# Patient Record
Sex: Male | Born: 1960 | Race: Black or African American | Hispanic: No | Marital: Married | Smoking: Former smoker
Health system: Southern US, Community
[De-identification: ages and names within clinical notes are randomized; demographics above are authoritative.]

---

## 1998-03-25 ENCOUNTER — Emergency Department (HOSPITAL_COMMUNITY): Admission: EM | Admit: 1998-03-25 | Discharge: 1998-03-25 | Payer: Self-pay | Admitting: Emergency Medicine

## 1998-05-23 ENCOUNTER — Encounter: Admission: RE | Admit: 1998-05-23 | Discharge: 1998-05-23 | Payer: Self-pay | Admitting: Family Medicine

## 1998-05-27 ENCOUNTER — Encounter: Admission: RE | Admit: 1998-05-27 | Discharge: 1998-05-27 | Payer: Self-pay | Admitting: Family Medicine

## 1998-06-06 ENCOUNTER — Ambulatory Visit (HOSPITAL_COMMUNITY): Admission: RE | Admit: 1998-06-06 | Discharge: 1998-06-06 | Payer: Self-pay | Admitting: Sports Medicine

## 1998-06-08 ENCOUNTER — Encounter: Admission: RE | Admit: 1998-06-08 | Discharge: 1998-06-08 | Payer: Self-pay | Admitting: Family Medicine

## 2005-01-22 ENCOUNTER — Emergency Department (HOSPITAL_COMMUNITY): Admission: EM | Admit: 2005-01-22 | Discharge: 2005-01-22 | Payer: Self-pay | Admitting: Emergency Medicine

## 2010-12-31 ENCOUNTER — Encounter: Payer: Self-pay | Admitting: Internal Medicine

## 2011-11-19 ENCOUNTER — Other Ambulatory Visit (HOSPITAL_BASED_OUTPATIENT_CLINIC_OR_DEPARTMENT_OTHER): Payer: Self-pay | Admitting: Internal Medicine

## 2011-11-19 DIAGNOSIS — N2889 Other specified disorders of kidney and ureter: Secondary | ICD-10-CM

## 2017-12-26 ENCOUNTER — Other Ambulatory Visit (HOSPITAL_BASED_OUTPATIENT_CLINIC_OR_DEPARTMENT_OTHER): Payer: Self-pay

## 2017-12-26 DIAGNOSIS — G4710 Hypersomnia, unspecified: Secondary | ICD-10-CM

## 2018-01-01 ENCOUNTER — Ambulatory Visit (HOSPITAL_BASED_OUTPATIENT_CLINIC_OR_DEPARTMENT_OTHER): Payer: BLUE CROSS/BLUE SHIELD | Attending: Internal Medicine | Admitting: Internal Medicine

## 2018-01-01 VITALS — Ht 71.0 in | Wt 265.0 lb

## 2018-01-01 DIAGNOSIS — R0902 Hypoxemia: Secondary | ICD-10-CM | POA: Diagnosis not present

## 2018-01-01 DIAGNOSIS — G4710 Hypersomnia, unspecified: Secondary | ICD-10-CM | POA: Diagnosis present

## 2018-01-01 DIAGNOSIS — G4733 Obstructive sleep apnea (adult) (pediatric): Secondary | ICD-10-CM | POA: Insufficient documentation

## 2018-01-04 DIAGNOSIS — G4733 Obstructive sleep apnea (adult) (pediatric): Secondary | ICD-10-CM | POA: Diagnosis not present

## 2018-01-04 NOTE — Procedures (Signed)
    Patient Name: Jacob Woodard, [name] Date: 01/01/2018 Gender: Male D.O.B: Mar 15, 1961 Age (years): 56 Referring Provider: Amada KingfisherGeorge Osei Bonsu Height (inches): 71 Interpreting Physician: Jetty Duhamellinton Erwin Nishiyama MD, ABSM Weight (lbs): 265 RPSGT: Celene KrasCharles, Nicole BMI: 37 MRN: [id number] Neck Size: 17.00 <br> <br> CLINICAL INFORMATION Sleep Study Type: HST  Indication for sleep study: Excessive Daytime Sleepiness  Epworth Sleepiness Score: 6  SLEEP STUDY TECHNIQUE A multi-channel overnight portable sleep study was performed. The channels recorded were: nasal airflow, thoracic respiratory movement, and oxygen saturation with a pulse oximetry. Snoring was also monitored.  MEDICATIONS Patient self administered medications include: none reported.  SLEEP ARCHITECTURE Patient was studied for 521.5 minutes. The sleep efficiency was 98.8 % and the patient was supine for 41.7%. The arousal index was 0.1 per hour.  RESPIRATORY PARAMETERS The overall AHI was 56.4 per hour, with a central apnea index of 0.0 per hour. The oxygen nadir was 72% during sleep.  CARDIAC DATA Mean heart rate during sleep was 49.4 bpm.  IMPRESSIONS - Severe obstructive sleep apnea occurred during this study (AHI = 56.4/h). - No significant central sleep apnea occurred during this study (CAI = 0.0/h). - Severe oxygen desaturation was noted during this study (Min O2 = 72%, Mean 92%). - Patient snored.  DIAGNOSIS - Obstructive Sleep Apnea (327.23 [G47.33 ICD-10]) - Nocturnal Hypoxemia (327.26 [G47.36 ICD-10])  RECOMMENDATIONS - Recommend CPAP titration study or AutoPAP. Other options would be based on clinical judgment. - Be careful with alcohol, sedatives and other CNS depressants that may worsen sleep apnea and disrupt normal sleep architecture. - Sleep hygiene should be reviewed to assess factors that may improve sleep quality. - Weight management and regular exercise should be initiated or  continued.  [Electronically signed] 01/04/2018 10:46 AM  Jetty Duhamellinton Chanele Douglas MD, ABSM Diplomate, American Board of Sleep Medicine   NPI: [id number]                         Jetty Duhamellinton Adi Doro Diplomate, American Board of Sleep Medicine  ELECTRONICALLY SIGNED ON:  01/04/2018, 10:44 AM [facility] PH: [phone]   FX: [phone] ACCREDITED BY THE AMERICAN ACADEMY OF SLEEP MEDICINE

## 2018-01-30 ENCOUNTER — Ambulatory Visit (INDEPENDENT_AMBULATORY_CARE_PROVIDER_SITE_OTHER): Payer: BLUE CROSS/BLUE SHIELD

## 2018-01-30 ENCOUNTER — Ambulatory Visit: Payer: BLUE CROSS/BLUE SHIELD | Admitting: Podiatry

## 2018-01-30 VITALS — Ht 71.0 in | Wt 265.0 lb

## 2018-01-30 DIAGNOSIS — M79671 Pain in right foot: Secondary | ICD-10-CM

## 2018-01-30 DIAGNOSIS — M25572 Pain in left ankle and joints of left foot: Secondary | ICD-10-CM | POA: Diagnosis not present

## 2018-01-30 DIAGNOSIS — M79672 Pain in left foot: Principal | ICD-10-CM

## 2018-01-30 DIAGNOSIS — M2142 Flat foot [pes planus] (acquired), left foot: Secondary | ICD-10-CM

## 2018-01-30 DIAGNOSIS — M2141 Flat foot [pes planus] (acquired), right foot: Secondary | ICD-10-CM

## 2018-01-30 DIAGNOSIS — M659 Synovitis and tenosynovitis, unspecified: Secondary | ICD-10-CM

## 2018-01-30 NOTE — Progress Notes (Signed)
   Subjective:    Patient ID: Jacob Woodard, male    DOB: 1961/03/23, 57 y.o.   MRN: [id number]  HPI  Chief Complaint  Patient presents with  . 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 male presents with the above complaint.  Reports bilateral foot pain for many years duration.  Patient works in his feet all day reports pain in the inside and outside of his ankles as the day wears on.   No past medical history on file.   Current Outpatient Medications:  .  amLODipine-benazepril (LOTREL) 5-20 MG capsule, , Disp: , Rfl: 1 .  metoprolol succinate (TOPROL-XL) 25 MG 24 hr tablet, , Disp: , Rfl: 0  No Known Allergies   Review of Systems  Musculoskeletal: Positive for arthralgias, back pain and joint swelling.  All other systems reviewed and are negative.     Objective:   Physical Exam There were no vitals filed for this visit. General AA&O x3. Normal mood and affect.  Vascular Dorsalis pedis and posterior tibial pulses  present 2+ bilaterally  Capillary refill normal to all digits. Pedal hair growth normal.  Neurologic Epicritic sensation grossly present bilaterally.  Dermatologic No open lesions. Interspaces clear of maceration. Nails well groomed and normal in appearance.  Orthopedic: MMT 5/5 in dorsiflexion, plantarflexion, inversion, and eversion bilaterally. Hindfoot flexible bilaterally  PT Tendon Tenderness: positive Sinus Tarsi Tenderness: positive Ankle ROM diminished range of motion bilaterally. Silfverskiold Test: positive bilaterally.   Radiographs: Taken and reviewed. Increased talar declination. Decreased calcaneal inclination.  No evidence of tarsal coalition.     Assessment & Plan:  Patient was evaluated all questions answered  Pes planus with sinus tarsitis left -X-rays taken and reviewed. -Left STJ injection as below  -Patient would benefit from custom  orthotics to control the rear foot motion.  Will make appointment to fabricate orthotics for patient  -Powerstep inserts dispensed.  Procedure: Joint Injection Location: Left STJ joint Skin Prep: Alcohol. Injectate: 0.5 cc 1% lidocaine plain, 1 cc dexamethasone phosphate, 0.5 cc Kenalog 10 Disposition: Patient tolerated procedure well. Injection site dressed with a band-aid.

## 2018-02-13 ENCOUNTER — Other Ambulatory Visit: Payer: Self-pay | Admitting: Podiatry

## 2018-02-13 ENCOUNTER — Ambulatory Visit (INDEPENDENT_AMBULATORY_CARE_PROVIDER_SITE_OTHER): Payer: BLUE CROSS/BLUE SHIELD

## 2018-02-13 ENCOUNTER — Encounter (HOSPITAL_COMMUNITY): Payer: Self-pay | Admitting: Family Medicine

## 2018-02-13 ENCOUNTER — Ambulatory Visit (HOSPITAL_COMMUNITY)
Admission: EM | Admit: 2018-02-13 | Discharge: 2018-02-13 | Disposition: A | Discharge status: Home / Self Care | Payer: BLUE CROSS/BLUE SHIELD | Attending: Family Medicine | Admitting: Family Medicine

## 2018-02-13 DIAGNOSIS — S63502A Unspecified sprain of left wrist, initial encounter: Secondary | ICD-10-CM

## 2018-02-13 DIAGNOSIS — M25532 Pain in left wrist: Secondary | ICD-10-CM

## 2018-02-13 DIAGNOSIS — M659 Synovitis and tenosynovitis, unspecified: Secondary | ICD-10-CM

## 2018-02-13 MED ORDER — DICLOFENAC SODIUM 75 MG PO TBEC: 75.0000 mg | DELAYED_RELEASE_TABLET | Freq: Two times a day (BID) | ORAL | 0 refills | Status: DC

## 2018-02-13 NOTE — ED Triage Notes (Signed)
Pt here for left wrist pain after a fall this am and catching himself with outstretched hands. He sts the right hand and wrist are okay.

## 2018-02-17 NOTE — ED Provider Notes (Signed)
[facility]   [id number] 02/13/18 Arrival Time: 1258  ASSESSMENT & PLAN:  1. Left wrist pain   2. Sprain of left wrist, initial encounter    Imaging: Dg Wrist Complete Left  Result Date: 02/13/2018 CLINICAL DATA:  Status post fall landing on the left wrist. Persistent pain centered over the fourth and fifth metacarpals and distal ulna. EXAM: LEFT WRIST - COMPLETE 3+ VIEW COMPARISON:  None in PACs FINDINGS: The bones are subjectively adequately mineralized. There is no acute fracture or dislocation. There are degenerative changes of the intercarpal joint centered on the articulation of the distal pole of the scaphoid with the trapezium and trapezoid. There is mild degenerative change of the first [facility]. There are subtle cystic changes within the distal ulna and the lunate. The metacarpals are intact as are the distal radius and ulna. Specific attention to the scaphoid reveals no acute abnormality. There is mild diffuse soft tissue swelling. IMPRESSION: There are degenerative changes centered on the articulation of the scaphoid with the trapezium and trapezoid. There is no acute fracture nor dislocation. Electronically Signed   By: David  SwazilandJordan M.D.   On: 02/13/2018 13:39   Meds ordered this encounter  Medications  . diclofenac (VOLTAREN) 75 MG EC tablet    Sig: Take 1 tablet (75 mg total) by mouth 2 (two) times daily.    Dispense:  20 tablet    Refill:  0   Placed in wrist splint. Will plan f/u with PCP if not noticing improvement over the next week. Reviewed expectations re: course of current medical issues. Questions answered. Outlined signs and symptoms indicating need for more acute intervention. Patient verbalized understanding. After Visit Summary given.  SUBJECTIVE: History from: patient. Jacob Woodard is a 57 y.o. male who reports persistent localized mild to moderate pain of his left wrist that is stable; described as aching without radiation. Onset: abrupt,  today. Injury/trama: yes, reports falling and catching himself with his hands. Relieved by: resting. Worsened by: certain movements. Associated symptoms: none reported. Extremity sensation changes or weakness: none. Self treatment: tried OTCs with relief of pain. History of similar: yes  ROS: As per HPI.   OBJECTIVE:  Vitals:   02/13/18 1319  BP: (!) 159/94  Pulse: 61  Resp: 18  Temp: 98.4 F (36.9 C)  SpO2: 97%    General appearance: alert; no distress Extremities: no cyanosis or edema; symmetrical with no gross deformities; localized tenderness over his left wrist with mild swelling and no bruising; ROM: normal but with stiffness reported CV: normal extremity capillary refill Skin: warm and dry Neurologic: normal gait; normal symmetric reflexes in all extremities; normal sensation in all extremities Psychological: alert and cooperative; normal mood and affect  No Known Allergies   Social History   Socioeconomic History  . Marital status: Married    Spouse name: Not on file  . Number of children: Not on file  . Years of education: Not on file  . Highest education level: Not on file  Social Needs  . Financial resource strain: Not on file  . Food insecurity - worry: Not on file  . Food insecurity - inability: Not on file  . Transportation needs - medical: Not on file  . Transportation needs - non-medical: Not on file  Occupational History  . Not on file  Tobacco Use  . Smoking status: Not on file  Substance and Sexual Activity  . Alcohol use: Not on file  . Drug use: Not on file  .  Sexual activity: Not on file  Other Topics Concern  . Not on file  Social History Narrative  . Not on file   History reviewed. No pertinent surgical history.    Mardella Layman, MD 02/17/18 [phone]

## 2018-02-20 ENCOUNTER — Other Ambulatory Visit: Payer: BLUE CROSS/BLUE SHIELD | Admitting: Orthotics

## 2018-02-20 ENCOUNTER — Ambulatory Visit: Payer: BLUE CROSS/BLUE SHIELD | Admitting: Podiatry

## 2018-02-27 ENCOUNTER — Ambulatory Visit (INDEPENDENT_AMBULATORY_CARE_PROVIDER_SITE_OTHER): Payer: BLUE CROSS/BLUE SHIELD | Admitting: Orthotics

## 2018-02-27 DIAGNOSIS — M659 Synovitis and tenosynovitis, unspecified: Secondary | ICD-10-CM | POA: Diagnosis not present

## 2018-02-27 DIAGNOSIS — M2141 Flat foot [pes planus] (acquired), right foot: Secondary | ICD-10-CM

## 2018-02-27 DIAGNOSIS — M25572 Pain in left ankle and joints of left foot: Secondary | ICD-10-CM

## 2018-02-27 DIAGNOSIS — M2142 Flat foot [pes planus] (acquired), left foot: Secondary | ICD-10-CM

## 2018-02-27 DIAGNOSIS — M79672 Pain in left foot: Secondary | ICD-10-CM

## 2018-02-27 DIAGNOSIS — M79671 Pain in right foot: Secondary | ICD-10-CM

## 2018-02-27 NOTE — Progress Notes (Signed)
Patient came into today to be cast for Custom Foot Orthotics. Upon recommendation of Dr. Samuella CotaPrice  Patient presents with pes planus, pes planovalgus, stage 1 PTTD, sinus tarsiitus Goals are  Arch support, rear foot stability Plan vendor [employer]

## 2018-03-24 ENCOUNTER — Ambulatory Visit: Payer: BLUE CROSS/BLUE SHIELD | Admitting: Orthotics

## 2018-03-24 DIAGNOSIS — M25572 Pain in left ankle and joints of left foot: Secondary | ICD-10-CM

## 2018-03-24 DIAGNOSIS — M2141 Flat foot [pes planus] (acquired), right foot: Secondary | ICD-10-CM

## 2018-03-24 DIAGNOSIS — M2142 Flat foot [pes planus] (acquired), left foot: Principal | ICD-10-CM

## 2018-03-24 NOTE — Progress Notes (Signed)
Patient came in today to pick up custom made foot orthotics.  The goals were accomplished and the patient reported no dissatisfaction with said orthotics.  Patient was advised of breakin period and how to report any issues.Patient came in today to pick up custom made foot orthotics.  The goals were accomplished and the patient reported no dissatisfaction with said orthotics.  Patient was advised of breakin period and how to report any issues. 

## 2018-07-04 ENCOUNTER — Encounter: Payer: Self-pay | Admitting: Podiatry

## 2018-07-04 ENCOUNTER — Ambulatory Visit: Payer: BLUE CROSS/BLUE SHIELD | Admitting: Podiatry

## 2018-07-04 ENCOUNTER — Ambulatory Visit (INDEPENDENT_AMBULATORY_CARE_PROVIDER_SITE_OTHER): Payer: BLUE CROSS/BLUE SHIELD

## 2018-07-04 DIAGNOSIS — S99912A Unspecified injury of left ankle, initial encounter: Secondary | ICD-10-CM | POA: Diagnosis not present

## 2018-07-04 DIAGNOSIS — R600 Localized edema: Secondary | ICD-10-CM

## 2018-07-04 MED ORDER — DICLOFENAC SODIUM 75 MG PO TBEC: 75.0000 mg | DELAYED_RELEASE_TABLET | Freq: Two times a day (BID) | ORAL | 2 refills | Status: DC

## 2018-07-05 NOTE — Progress Notes (Signed)
Subjective:   Patient ID: Jacob LazarAnthony B Woodard, male   DOB: 57 y.o.   MRN: [id number]   HPI Patient states he jumped over a fence when playing tennis came down and hurt his ankle left and is been very sore and he is due to have to be on his foot for extended periods of time   ROS      Objective:  Physical Exam  Neurovascular status intact with inflammation more of the medial portion of the ankle joint with the deltoid ligament involved.  The lateral appears to be okay with mild edema and he does have reduced range of motion     Assessment:  Severe ankle sprain deformity of the left ankle     Plan:  H&P x-rays reviewed and today applied Unna boot Ace wrap surgical shoe to reduce the edema discussed rest ice compression and dispensed a Tri-Lock ankle brace in order to provide support of the entire ankle.  Patient will be seen back for us to reevaluate in 3 weeks or earlier if needed  X-ray indicates that there is no signs of fracture or diastases injury currently

## 2019-08-19 ENCOUNTER — Other Ambulatory Visit: Payer: Self-pay | Admitting: Podiatry

## 2020-03-24 DIAGNOSIS — Z01021 Encounter for examination of eyes and vision following failed vision screening with abnormal findings: Secondary | ICD-10-CM | POA: Diagnosis not present

## 2020-03-24 DIAGNOSIS — E119 Type 2 diabetes mellitus without complications: Secondary | ICD-10-CM | POA: Diagnosis not present

## 2020-03-24 DIAGNOSIS — Z01118 Encounter for examination of ears and hearing with other abnormal findings: Secondary | ICD-10-CM | POA: Diagnosis not present

## 2020-03-24 DIAGNOSIS — Z0001 Encounter for general adult medical examination with abnormal findings: Secondary | ICD-10-CM | POA: Diagnosis not present

## 2020-03-24 DIAGNOSIS — G4700 Insomnia, unspecified: Secondary | ICD-10-CM | POA: Diagnosis not present

## 2020-03-24 DIAGNOSIS — E782 Mixed hyperlipidemia: Secondary | ICD-10-CM | POA: Diagnosis not present

## 2020-03-24 DIAGNOSIS — Z136 Encounter for screening for cardiovascular disorders: Secondary | ICD-10-CM | POA: Diagnosis not present

## 2020-03-24 DIAGNOSIS — I4891 Unspecified atrial fibrillation: Secondary | ICD-10-CM | POA: Diagnosis not present

## 2020-03-24 DIAGNOSIS — Z1329 Encounter for screening for other suspected endocrine disorder: Secondary | ICD-10-CM | POA: Diagnosis not present

## 2020-03-24 DIAGNOSIS — I10 Essential (primary) hypertension: Secondary | ICD-10-CM | POA: Diagnosis not present

## 2020-04-08 DIAGNOSIS — I10 Essential (primary) hypertension: Secondary | ICD-10-CM | POA: Diagnosis not present

## 2020-04-08 DIAGNOSIS — I4891 Unspecified atrial fibrillation: Secondary | ICD-10-CM | POA: Diagnosis not present

## 2020-06-22 ENCOUNTER — Other Ambulatory Visit: Payer: Self-pay

## 2020-06-22 ENCOUNTER — Ambulatory Visit: Payer: Self-pay | Admitting: Podiatry

## 2020-06-22 ENCOUNTER — Encounter: Payer: Self-pay | Admitting: Podiatry

## 2020-06-22 ENCOUNTER — Other Ambulatory Visit: Payer: Self-pay | Admitting: Podiatry

## 2020-06-22 ENCOUNTER — Ambulatory Visit (INDEPENDENT_AMBULATORY_CARE_PROVIDER_SITE_OTHER): Payer: Self-pay

## 2020-06-22 VITALS — Temp 98.2°F

## 2020-06-22 DIAGNOSIS — M25572 Pain in left ankle and joints of left foot: Secondary | ICD-10-CM

## 2020-06-22 DIAGNOSIS — M76821 Posterior tibial tendinitis, right leg: Secondary | ICD-10-CM

## 2020-06-22 DIAGNOSIS — M25571 Pain in right ankle and joints of right foot: Secondary | ICD-10-CM

## 2020-06-22 DIAGNOSIS — M76822 Posterior tibial tendinitis, left leg: Secondary | ICD-10-CM

## 2020-06-22 MED ORDER — DICLOFENAC SODIUM 75 MG PO TBEC: 75.0000 mg | DELAYED_RELEASE_TABLET | Freq: Two times a day (BID) | ORAL | 2 refills | Status: AC

## 2020-06-23 NOTE — Progress Notes (Signed)
Subjective:   Patient ID: Jacob Woodard, male   DOB: 60 y.o.   MRN: [id number]   HPI Patient presents stating that my left ankle has really started to bother me and states that it is been worse when he tries to walk or be active neuro   ROS      Objective:  Physical Exam  Neurovascular status intact with patient's left medial ankle posterior tibial tendon just before it gets to the medial malleolus being inflamed with no indications of tendon damage or dysfunction.  Moderate depression of the arch noted has orthotics which are in not he feels fitted exactly right      Assessment:  Probability for posterior tibial tendinitis left with moderate flatfoot deformity     Plan:  H&P reviewed condition recommended steroidal injection careful and explained chances for rupture.  Sterile prep done injected more proximal into that she 3 mg dexamethasone Kenalog 5 mg Xylocaine applied fascial brace to lift the arch gave instructions for seeing rec for orthotics and supportive shoes and reappoint to recheck  X-rays indicate moderate depression of the arch bilateral with signs of arthritis that are minimal with no other pathology noted

## 2020-08-08 DIAGNOSIS — M25572 Pain in left ankle and joints of left foot: Secondary | ICD-10-CM | POA: Diagnosis not present

## 2020-12-19 DIAGNOSIS — M2141 Flat foot [pes planus] (acquired), right foot: Secondary | ICD-10-CM | POA: Diagnosis not present

## 2020-12-19 DIAGNOSIS — M19071 Primary osteoarthritis, right ankle and foot: Secondary | ICD-10-CM | POA: Diagnosis not present

## 2020-12-19 DIAGNOSIS — M792 Neuralgia and neuritis, unspecified: Secondary | ICD-10-CM | POA: Diagnosis not present

## 2020-12-19 DIAGNOSIS — M19072 Primary osteoarthritis, left ankle and foot: Secondary | ICD-10-CM | POA: Diagnosis not present

## 2021-01-03 DIAGNOSIS — M19072 Primary osteoarthritis, left ankle and foot: Secondary | ICD-10-CM | POA: Diagnosis not present

## 2021-01-03 DIAGNOSIS — M2141 Flat foot [pes planus] (acquired), right foot: Secondary | ICD-10-CM | POA: Diagnosis not present

## 2021-01-03 DIAGNOSIS — M2142 Flat foot [pes planus] (acquired), left foot: Secondary | ICD-10-CM | POA: Diagnosis not present

## 2021-01-03 DIAGNOSIS — M19071 Primary osteoarthritis, right ankle and foot: Secondary | ICD-10-CM | POA: Diagnosis not present

## 2021-02-28 DIAGNOSIS — M25374 Other instability, right foot: Secondary | ICD-10-CM | POA: Diagnosis not present

## 2021-02-28 DIAGNOSIS — R2689 Other abnormalities of gait and mobility: Secondary | ICD-10-CM | POA: Diagnosis not present

## 2021-02-28 DIAGNOSIS — M25375 Other instability, left foot: Secondary | ICD-10-CM | POA: Diagnosis not present

## 2021-02-28 DIAGNOSIS — M19072 Primary osteoarthritis, left ankle and foot: Secondary | ICD-10-CM | POA: Diagnosis not present

## 2021-02-28 DIAGNOSIS — M19071 Primary osteoarthritis, right ankle and foot: Secondary | ICD-10-CM | POA: Diagnosis not present

## 2021-10-23 DIAGNOSIS — S61209A Unspecified open wound of unspecified finger without damage to nail, initial encounter: Secondary | ICD-10-CM | POA: Diagnosis not present

## 2021-12-13 ENCOUNTER — Other Ambulatory Visit: Payer: Self-pay

## 2021-12-13 ENCOUNTER — Encounter (HOSPITAL_COMMUNITY): Payer: Self-pay

## 2021-12-13 ENCOUNTER — Emergency Department (HOSPITAL_COMMUNITY)
Admission: EM | Admit: 2021-12-13 | Discharge: 2021-12-14 | Disposition: A | Discharge status: Home / Self Care | Payer: BC Managed Care – PPO | Attending: Emergency Medicine | Admitting: Emergency Medicine

## 2021-12-13 DIAGNOSIS — M5442 Lumbago with sciatica, left side: Secondary | ICD-10-CM | POA: Insufficient documentation

## 2021-12-13 DIAGNOSIS — M545 Low back pain, unspecified: Secondary | ICD-10-CM | POA: Diagnosis not present

## 2021-12-13 DIAGNOSIS — Z79899 Other long term (current) drug therapy: Secondary | ICD-10-CM | POA: Insufficient documentation

## 2021-12-13 DIAGNOSIS — I10 Essential (primary) hypertension: Secondary | ICD-10-CM | POA: Diagnosis not present

## 2021-12-13 DIAGNOSIS — M5432 Sciatica, left side: Secondary | ICD-10-CM

## 2021-12-13 MED ORDER — OXYCODONE-ACETAMINOPHEN 5-325 MG PO TABS
1.0000 | ORAL_TABLET | Freq: Once | ORAL | Status: AC
  Administered 2021-12-13: 1 via ORAL
  Filled 2021-12-13 (×2): qty 1

## 2021-12-13 NOTE — ED Triage Notes (Signed)
Pt arrived POV c/o left leg pain. Pt states the pain has been going on for a few days but worse today. Pt believes it might be sciatica. Pt also states his BP is elevated but not currently being treated.

## 2021-12-13 NOTE — ED Provider Triage Note (Signed)
Emergency Medicine Provider Triage Evaluation Note  Jacob Woodard , a 61 y.o. male  was evaluated in triage.  Pt complains of left back and leg pain.  Pt thinks he has sciatica  Review of Systems  Positive: Numbness left leg Negative: No abdominal pain  Physical Exam  BP (!) 158/120 (BP Location: Left Arm)    Pulse (!) 118    Temp 98.9 F (37.2 C) (Oral)    Resp 18    Ht 5\' 11"  (1.803 m)    Wt 81.6 kg    SpO2 100%    BMI 25.10 kg/m  Gen:   Awake, no distress   Resp:  Normal effort  MSK:   Moves extremities without difficulty  Other:  Pain with movement left leg  Medical Decision Making  Medically screening exam initiated at 5:05 PM.  Appropriate orders placed.  LATRELL BONDY was informed that the remainder of the evaluation will be completed by another provider, this initial triage assessment does not replace that evaluation, and the importance of remaining in the ED until their evaluation is complete.     Fransico Meadow, [place] 12/13/21 1706

## 2021-12-14 DIAGNOSIS — M9904 Segmental and somatic dysfunction of sacral region: Secondary | ICD-10-CM | POA: Diagnosis not present

## 2021-12-14 DIAGNOSIS — M9902 Segmental and somatic dysfunction of thoracic region: Secondary | ICD-10-CM | POA: Diagnosis not present

## 2021-12-14 DIAGNOSIS — M9903 Segmental and somatic dysfunction of lumbar region: Secondary | ICD-10-CM | POA: Diagnosis not present

## 2021-12-14 DIAGNOSIS — M5416 Radiculopathy, lumbar region: Secondary | ICD-10-CM | POA: Diagnosis not present

## 2021-12-14 MED ORDER — KETOROLAC TROMETHAMINE 15 MG/ML IJ SOLN
15.0000 mg | Freq: Once | INTRAMUSCULAR | Status: AC
  Administered 2021-12-14: 15 mg via INTRAMUSCULAR
  Filled 2021-12-14: qty 1

## 2021-12-14 MED ORDER — LIDOCAINE 5 % EX PTCH
1.0000 | MEDICATED_PATCH | CUTANEOUS | Status: DC
  Administered 2021-12-14: 1 via TRANSDERMAL
  Filled 2021-12-14: qty 1

## 2021-12-14 MED ORDER — PREDNISONE 20 MG PO TABS: 40.0000 mg | ORAL_TABLET | Freq: Every day | ORAL | 0 refills | Status: AC

## 2021-12-14 MED ORDER — AMLODIPINE BESYLATE 5 MG PO TABS: 5.0000 mg | ORAL_TABLET | Freq: Every day | ORAL | 0 refills | Status: AC

## 2021-12-14 MED ORDER — LIDOCAINE 4 % EX PTCH: 1.0000 | MEDICATED_PATCH | Freq: Two times a day (BID) | CUTANEOUS | 0 refills | Status: AC | PRN

## 2021-12-14 NOTE — ED Notes (Signed)
Discharge instructions reviewed with patient and family. Patient and family verbalized understanding of instructions. Follow-up care and medications were reviewed. Patient wheeled out of ED in wheelchair. VSS upon discharge.  °

## 2021-12-14 NOTE — ED Provider Notes (Addendum)
[facility] EMERGENCY DEPARTMENT Provider Note   CSN: [id number] Arrival date & time: 12/13/21  1612     History  Chief Complaint  Patient presents with   Leg Pain    Jacob Woodard is a 61 y.o. male with past medical history of hypertension (noncompliant with medication).  Presents emergency department with a chief complaint of back pain and hypertension.  Patient reports that his back pain has been present over the last 4 days.  Pain is located to left lumbar back and radiates to left leg.  Pain is constant.  At present patient rates pain 10.  Pain scale.  Patient is worse with movement.  Patient reports taking Aleve earlier yesterday morning with no improvement in his symptoms.  Patient describes pain as "spasm and aching."  Patient denies any recent falls or traumatic injuries.  States that he occasionally does heavy lifting but does not remember injuring himself.  Patient reports that he has a history of high blood pressure.  States that he was previously prescribed Norvasc.  Patient states that he has run out of this medication and has not taken it recently.  States that he is currently transitioning between primary care providers.  Patient denies any fever, chills, unexpected weight loss, night sweats, IV drug use, history of malignancy, dysuria, hematuria, urinary urgency, numbness, weakness, saddle anesthesia, bowel or bladder dysfunction, chest pain, shortness of breath, visual disturbance.   Leg Pain Associated symptoms: back pain   Associated symptoms: no fever and no neck pain       Home Medications Prior to Admission medications   Medication Sig Start Date End Date Taking? Authorizing Provider  amLODipine-benazepril (LOTREL) 5-20 MG capsule  01/18/18   [provider]  diclofenac (VOLTAREN) 75 MG EC tablet Take 1 tablet (75 mg total) by mouth 2 (two) times daily. 06/22/20   Wallene Huh, DPM  metoprolol succinate (TOPROL-XL) 25 MG 24 hr  tablet  10/25/17   [provider]      Allergies    Patient has no known allergies.    Review of Systems   Review of Systems  Constitutional:  Negative for chills and fever.  Eyes:  Negative for visual disturbance.  Respiratory:  Negative for shortness of breath.   Cardiovascular:  Negative for chest pain.  Gastrointestinal:  Negative for abdominal pain, nausea and vomiting.  Genitourinary:  Negative for difficulty urinating, dysuria, frequency, hematuria and urgency.  Musculoskeletal:  Positive for back pain. Negative for neck pain.  Skin:  Negative for color change and rash.  Neurological:  Negative for dizziness, syncope, weakness, light-headedness, numbness and headaches.  Psychiatric/Behavioral:  Negative for confusion.    Physical Exam Updated Vital Signs BP (!) 175/123 (BP Location: Left Arm)    Pulse 72    Temp 98.4 F (36.9 C) (Oral)    Resp 20    Ht 5\' 11"  (1.803 m)    Wt 81.6 kg    SpO2 100%    BMI 25.10 kg/m  Physical Exam Vitals and nursing note reviewed.  Constitutional:      General: He is not in acute distress.    Appearance: He is not ill-appearing, toxic-appearing or diaphoretic.  HENT:     Head: Normocephalic.  Eyes:     General: No scleral icterus.       Right eye: No discharge.        Left eye: No discharge.  Cardiovascular:     Rate and Rhythm: Normal rate.  Pulses:          Dorsalis pedis pulses are 2+ on the right side and 2+ on the left side.  Pulmonary:     Effort: Pulmonary effort is normal.  Abdominal:     Tenderness: There is no right CVA tenderness or left CVA tenderness.  Musculoskeletal:     Cervical back: No swelling, edema, deformity, erythema, signs of trauma, lacerations, rigidity, spasms, torticollis, tenderness, bony tenderness or crepitus. No pain with movement. Normal range of motion.     Thoracic back: No swelling, edema, deformity, signs of trauma, lacerations, spasms, tenderness or bony tenderness.     Lumbar back:  Tenderness present. No swelling, edema, deformity, signs of trauma, lacerations, spasms or bony tenderness. Positive left straight leg raise test. Negative right straight leg raise test.     Right hip: No deformity, tenderness, bony tenderness or crepitus.     Left hip: No deformity, tenderness, bony tenderness or crepitus.     Right upper leg: Normal.     Left upper leg: Normal.     Right knee: No swelling, deformity, effusion, erythema, ecchymosis, lacerations, bony tenderness or crepitus. Normal range of motion. No tenderness. Normal alignment.     Left knee: No swelling, deformity, effusion, erythema, ecchymosis, lacerations, bony tenderness or crepitus. Normal range of motion. No tenderness. Normal alignment.     Right ankle: No swelling, deformity, ecchymosis or lacerations. No tenderness. Normal range of motion.     Left ankle: No swelling, deformity, ecchymosis or lacerations. No tenderness. Normal range of motion.     Right foot: Normal range of motion and normal capillary refill. No swelling, deformity, laceration, tenderness, bony tenderness or crepitus. Normal pulse.     Left foot: Normal range of motion and normal capillary refill. No swelling, laceration, tenderness, bony tenderness or crepitus. Normal pulse.     Comments: Tenderness to left lumbar back  Skin:    General: Skin is warm and dry.  Neurological:     General: No focal deficit present.     Mental Status: He is alert.  Psychiatric:        Behavior: Behavior is cooperative.    ED Results / Procedures / Treatments   Labs (all labs ordered are listed, but only abnormal results are displayed) Labs Reviewed - No data to display  EKG None  Radiology No results found.  Procedures Procedures    Medications Ordered in ED Medications  oxyCODONE-acetaminophen (PERCOCET/ROXICET) 5-325 MG per tablet 1 tablet (1 tablet Oral Given 12/13/21 1711)    ED Course/ Medical Decision Making/ A&P                            Medical Decision Making   This patient presents to the ED for concern of back pain and hypertension, this involves an extensive number of treatment options, and is a complaint that carries with it a high risk of complications and morbidity.  The differential diagnosis includes but is not limited to UTI, pyelonephritis, renal calculus, muscle strain, malignancy   Co morbidities that complicate the patient evaluation  Hypertension   Additional history obtained:  Additional history obtained from patient's daughter at bedside   Medicines ordered and prescription drug management:  I ordered medication including Toradol and lidocaine patch for patient's pain Reevaluation of the patient after these medicines showed that the patient stayed the same I have reviewed the patients home medicines and have made adjustments as needed  Test Considered:  Urinalysis, lumbar x-ray, left hip x-ray Low suspicion for pyelonephritis versus renal calculus as patient denies any urinary symptoms. Low suspicion for fracture as patient has no recent falls or traumatic injuries.  No bony tenderness on exam.     Problem List / ED Course:  Back pain Hypertension       Dispostion:  After consideration of the diagnostic results and the patients response to treatment, I feel that the patent would benefit from prescription for steroids and lidocaine patch.  Suspect that patient's pain is due to sciatica versus muscle strain.  Patient given information to follow-up with orthopedic provider if pain is not improved.  Discussed symptomatic treatment with over-the-counter pain medication.  Additionally patient has documented history of hypertension.  Patient reports he is no longer taking any medications for his hypertension.  We will start patient on 5 mg Norvasc.  Patient to follow-up with PCP for further management of hypertension.  Discussed results, findings, treatment and follow up. Patient advised of  return precautions. Patient verbalized understanding and agreed with plan.          Final Clinical Impression(s) / ED Diagnoses Final diagnoses:  Hypertension, unspecified type  Sciatica of left side    Rx / DC Orders ED Discharge Orders          Ordered    amLODipine (NORVASC) 5 MG tablet  Daily        12/14/21 0100    predniSONE (DELTASONE) 20 MG tablet  Daily        12/14/21 0100    Lidocaine (HM LIDOCAINE PATCH) 4 % PTCH  Every 12 hours PRN        12/14/21 0100              Loni Beckwith, PA-C 12/14/21 0227    Loni Beckwith, PA-C 12/14/21 0228    Margette Fast, MD 12/17/21 [phone]

## 2021-12-14 NOTE — Discharge Instructions (Addendum)
You came to the emergency department today to be evaluated for your back pain and high blood pressure.  Your blood pressure was found to be elevated in the emergency department.  I have started you on the medication Norvasc.  Please take this medication as prescribed.  Please follow-up with your primary care provider for further management of your high blood pressure.  Your back and leg pain is consistent with sciatica.  Due to this I prescribed you with steroids.  Some common side effects include feelings of extra energy, feeling warm, increased appetite, and stomach upset.  If you are diabetic your sugars may run higher than usual.   Please take Ibuprofen (Advil, motrin) and Tylenol (acetaminophen) to relieve your pain.    You may take up to 600 MG (3 pills) of normal strength ibuprofen every 8 hours as needed.   You make take tylenol, up to 1,000 mg (two extra strength pills) every 8 hours as needed.   It is safe to take ibuprofen and tylenol at the same time as they work differently.   Do not take more than 3,000 mg tylenol in a 24 hour period (not more than one dose every 8 hours.  Please check all medication labels as many medications such as pain and cold medications may contain tylenol.  Do not drink alcohol while taking these medications.  Do not take other NSAID'S while taking ibuprofen (such as aleve or naproxen).  Please take ibuprofen with food to decrease stomach upset.  Get help right away if: You are not able to control when you urinate or have bowel movements (incontinence). You have: Weakness in your lower back, pelvis, buttocks, or legs that gets worse. Redness or swelling of your back. A burning sensation when you urinate. Develop a severe headache or confusion. Have unusual weakness or numbness. Feel faint. Have severe pain in your chest or abdomen. Vomit repeatedly. Have trouble breathing.

## 2021-12-15 ENCOUNTER — Other Ambulatory Visit: Payer: Self-pay

## 2021-12-15 ENCOUNTER — Emergency Department (HOSPITAL_COMMUNITY)
Admission: EM | Admit: 2021-12-15 | Discharge: 2021-12-15 | Disposition: A | Discharge status: Home / Self Care | Payer: BC Managed Care – PPO | Attending: Emergency Medicine | Admitting: Emergency Medicine

## 2021-12-15 ENCOUNTER — Encounter (HOSPITAL_COMMUNITY): Payer: Self-pay

## 2021-12-15 ENCOUNTER — Emergency Department (HOSPITAL_COMMUNITY): Payer: BC Managed Care – PPO

## 2021-12-15 DIAGNOSIS — M5432 Sciatica, left side: Secondary | ICD-10-CM | POA: Insufficient documentation

## 2021-12-15 DIAGNOSIS — I10 Essential (primary) hypertension: Secondary | ICD-10-CM | POA: Diagnosis not present

## 2021-12-15 DIAGNOSIS — M5442 Lumbago with sciatica, left side: Secondary | ICD-10-CM | POA: Diagnosis not present

## 2021-12-15 DIAGNOSIS — M25552 Pain in left hip: Secondary | ICD-10-CM | POA: Diagnosis not present

## 2021-12-15 DIAGNOSIS — M545 Low back pain, unspecified: Secondary | ICD-10-CM | POA: Diagnosis not present

## 2021-12-15 DIAGNOSIS — Z79899 Other long term (current) drug therapy: Secondary | ICD-10-CM | POA: Insufficient documentation

## 2021-12-15 MED ORDER — TIZANIDINE HCL 4 MG PO TABS
2.0000 mg | ORAL_TABLET | Freq: Once | ORAL | Status: AC
  Administered 2021-12-15: 2 mg via ORAL
  Filled 2021-12-15: qty 1

## 2021-12-15 MED ORDER — DEXAMETHASONE SODIUM PHOSPHATE 10 MG/ML IJ SOLN
10.0000 mg | Freq: Once | INTRAMUSCULAR | Status: AC
  Administered 2021-12-15: 10 mg via INTRAVENOUS
  Filled 2021-12-15: qty 1

## 2021-12-15 MED ORDER — HYDROCODONE-ACETAMINOPHEN 5-325 MG PO TABS: 1.0000 | ORAL_TABLET | Freq: Four times a day (QID) | ORAL | 0 refills | Status: AC | PRN

## 2021-12-15 MED ORDER — HYDROMORPHONE HCL 1 MG/ML IJ SOLN
1.0000 mg | Freq: Once | INTRAMUSCULAR | Status: AC
  Administered 2021-12-15: 1 mg via INTRAVENOUS
  Filled 2021-12-15: qty 1

## 2021-12-15 NOTE — ED Triage Notes (Addendum)
Pt BIB [facility] from home. Patient states as he was walking he felt a pop in the left hip and fell straight to the ground. Pt has a hx of left hip pain. No obvious deformities to left hip. Patient received 200 of fentanyl by EMS. Patient was placed on amlodipine for hypertension earlier this week.  Vital signs were:  200/130 84- HR

## 2021-12-15 NOTE — ED Provider Notes (Signed)
[facility]-EMERGENCY DEPT Provider Note   CSN: [id number] Arrival date & time: 12/15/21  1148     History  No chief complaint on file.   Jacob Woodard is a 61 y.o. male.  HPI Patient presents 2 days after being seen at our affiliated facility now off concern for acute exacerbation of left superior inferior back pain following an audible pop sensation.  He notes that this illness has been about 1 week in duration, he was seen and evaluated 2 days ago, diagnosed with musculoskeletal etiology for his pain, has followed up with without like a chiropractor has had x-rays that were reassuring.  Today, just prior to ED arrival he was taking a step when he felt an audible pop, since that time he had severe 15/10 pain in the left posterior lateral superior hip.  He had relief with IV narcotics provided by EMS.  He did not completely fall, has no other complaints of pain.  Pain is radiating from the back down the left leg circumferentially towards the medial left knee.  No incontinence, no fever, no loss of sensation distally.  Pain is worse with motion, better at rest.    Home Medications Prior to Admission medications   Medication Sig Start Date End Date Taking? Authorizing Provider  amLODipine (NORVASC) 5 MG tablet Take 1 tablet (5 mg total) by mouth daily. 12/14/21  Yes Haskel SchroederBadalamente, Peter R, PA-C  HYDROcodone-acetaminophen (NORCO/VICODIN) 5-325 MG tablet Take 1 tablet by mouth every 6 (six) hours as needed for severe pain. 12/15/21  Yes Gerhard MunchLockwood, Mitchelle Goerner, MD  ibuprofen (ADVIL) 200 MG tablet Take 400 mg by mouth every 6 (six) hours as needed for mild pain.   Yes [provider]  Lidocaine (HM LIDOCAINE PATCH) 4 % PTCH Apply 1 patch topically every 12 (twelve) hours as needed. 12/14/21  Yes Haskel SchroederBadalamente, Peter R, PA-C  predniSONE (DELTASONE) 20 MG tablet Take 2 tablets (40 mg total) by mouth daily for 5 days. 12/14/21 12/19/21 Yes Badalamente, Rose PhiPeter R, PA-C  diclofenac  (VOLTAREN) 75 MG EC tablet Take 1 tablet (75 mg total) by mouth 2 (two) times daily. Patient not taking: Reported on 12/15/2021 06/22/20   Lenn Sinkegal, Norman S, DPM      Allergies    Patient has no known allergies.    Review of Systems   Review of Systems  Constitutional:        Per HPI, otherwise negative  HENT:         Per HPI, otherwise negative  Respiratory:         Per HPI, otherwise negative  Cardiovascular:        Per HPI, otherwise negative  Gastrointestinal:  Negative for vomiting.  Endocrine:       Negative aside from HPI  Genitourinary:        Neg aside from HPI   Musculoskeletal:        Per HPI, otherwise negative  Skin: Negative.   Neurological:  Negative for syncope.   Physical Exam Updated Vital Signs BP (!) 153/122    Pulse 85    Temp 98.4 F (36.9 C) (Oral)    Resp 19    Ht 5\' 11"  (1.803 m)    Wt 81.6 kg    SpO2 93%    BMI 25.09 kg/m  Physical Exam Vitals and nursing note reviewed.  Constitutional:      General: He is not in acute distress.    Appearance: He is well-developed.  HENT:  Head: Normocephalic and atraumatic.  Eyes:     Conjunctiva/sclera: Conjunctivae normal.  Cardiovascular:     Rate and Rhythm: Normal rate and regular rhythm.     Pulses: Normal pulses.  Pulmonary:     Effort: Pulmonary effort is normal. No respiratory distress.     Breath sounds: No stridor.  Abdominal:     General: There is no distension.  Musculoskeletal:     Comments: No deformities.  Patient moves his left leg initially in an animated fashion stating that he feels better, but with small amounts of motion he has acute recurrence of pain, focally in the left lateral superior posterior hip region.  Knee unremarkable, ankle unremarkable.  Skin:    General: Skin is warm and dry.  Neurological:     Mental Status: He is alert and oriented to person, place, and time.    ED Results / Procedures / Treatments   Labs (all labs ordered are listed, but only abnormal results  are displayed) Labs Reviewed - No data to display  EKG None  Radiology DG Lumbar Spine Complete  Result Date: 12/15/2021 CLINICAL DATA:  pain at L lateral inferior area after "pop" sensation EXAM: LUMBAR SPINE - COMPLETE 4+ VIEW COMPARISON:  None. FINDINGS: There appear to be small ribs at T12. The last well-defined vertebral body is labeled L5 for purposes of this exam. There are 5 non-rib-bearing lumbar-type vertebral bodies. Normal alignment. The lumbar vertebral body heights are maintained. There is multilevel degenerative disc disease of the lower lumbar spine which is most advanced and moderate in severity at L4-L5. The soft tissues are unremarkable. IMPRESSION: Multilevel degenerative disc disease of the lower lumbar spine most pronounced at L4-L5. Electronically Signed   By: Olive Bass M.D.   On: 12/15/2021 14:05    Procedures Procedures    Medications Ordered in ED Medications  HYDROmorphone (DILAUDID) injection 1 mg (1 mg Intravenous Given 12/15/21 1250)  tiZANidine (ZANAFLEX) tablet 2 mg (2 mg Oral Given 12/15/21 1247)  dexamethasone (DECADRON) injection 10 mg (10 mg Intravenous Given 12/15/21 1248)    ED Course/ Medical Decision Making/ A&P                           Medical Decision Making With consideration of fracture versus lumbosacral radiculopathy, consideration of intra abdominal injury patient had x-ray ordered, received analgesics and I reviewed his chart.  Chart review notable for recent evaluation with initiation of steroids, anti-inflammatories.  Patient joined by his wife  3:19 PM Patient states that this is the best he has felt in about 1 week.  He, his wife and I discussed all findings and have reviewed the x-ray, interpreted myself, agreed with interpretation and provided this to the patient.  I reviewed exam the patient is moving all extremities, continues to deny distal complaints, states that he feels better.  On reviewing his chart, reviewing his x-rays,  vital signs, physical exam, no negation for CNS involvement, no evidence for fracture, no evidence for soft tissue mass.  Some suspicion for lumbosacral radiculopathy, possibly exacerbated by today's acute event.  With improvement here, after initiation of additional medications patient is appropriate for discharge with referrals.        Final Clinical Impression(s) / ED Diagnoses Final diagnoses:  Acute left-sided low back pain with left-sided sciatica    Rx / DC Orders ED Discharge Orders          Ordered    HYDROcodone-acetaminophen (NORCO/VICODIN)  5-325 MG tablet  Every 6 hours PRN        12/15/21 1514              Gerhard Munch, MD 12/15/21 1520

## 2021-12-15 NOTE — Discharge Instructions (Addendum)
As discussed, your pain is likely due to soft tissue inflammation, possibly impingement of the disc upon the nerves as they exit your spine and control your left leg.  It is important you schedule follow-up with one of our 2 associates, either Dr. Rolena Infante or one of our neurosurgeons.  Please call for an appointment.  In the interim, please continue taking your medications previously prescribed including ibuprofen, 4 mg, 3 times daily as well as your newly prescribed pain medication.  In addition, please obtain and use medicated patches including the ingredients lidocaine and methyl salicylate.  Return here for concerning changes in your condition.

## 2021-12-18 DIAGNOSIS — M5416 Radiculopathy, lumbar region: Secondary | ICD-10-CM | POA: Diagnosis not present

## 2021-12-18 DIAGNOSIS — M9902 Segmental and somatic dysfunction of thoracic region: Secondary | ICD-10-CM | POA: Diagnosis not present

## 2021-12-18 DIAGNOSIS — M9903 Segmental and somatic dysfunction of lumbar region: Secondary | ICD-10-CM | POA: Diagnosis not present

## 2021-12-18 DIAGNOSIS — M9904 Segmental and somatic dysfunction of sacral region: Secondary | ICD-10-CM | POA: Diagnosis not present

## 2021-12-19 DIAGNOSIS — M9904 Segmental and somatic dysfunction of sacral region: Secondary | ICD-10-CM | POA: Diagnosis not present

## 2021-12-19 DIAGNOSIS — M9902 Segmental and somatic dysfunction of thoracic region: Secondary | ICD-10-CM | POA: Diagnosis not present

## 2021-12-19 DIAGNOSIS — M9903 Segmental and somatic dysfunction of lumbar region: Secondary | ICD-10-CM | POA: Diagnosis not present

## 2021-12-19 DIAGNOSIS — M5416 Radiculopathy, lumbar region: Secondary | ICD-10-CM | POA: Diagnosis not present

## 2021-12-20 DIAGNOSIS — M9903 Segmental and somatic dysfunction of lumbar region: Secondary | ICD-10-CM | POA: Diagnosis not present

## 2021-12-20 DIAGNOSIS — M5416 Radiculopathy, lumbar region: Secondary | ICD-10-CM | POA: Diagnosis not present

## 2021-12-20 DIAGNOSIS — M9904 Segmental and somatic dysfunction of sacral region: Secondary | ICD-10-CM | POA: Diagnosis not present

## 2021-12-20 DIAGNOSIS — M9902 Segmental and somatic dysfunction of thoracic region: Secondary | ICD-10-CM | POA: Diagnosis not present

## 2021-12-25 DIAGNOSIS — M5416 Radiculopathy, lumbar region: Secondary | ICD-10-CM | POA: Diagnosis not present

## 2021-12-25 DIAGNOSIS — M9902 Segmental and somatic dysfunction of thoracic region: Secondary | ICD-10-CM | POA: Diagnosis not present

## 2021-12-25 DIAGNOSIS — M9904 Segmental and somatic dysfunction of sacral region: Secondary | ICD-10-CM | POA: Diagnosis not present

## 2021-12-25 DIAGNOSIS — M9903 Segmental and somatic dysfunction of lumbar region: Secondary | ICD-10-CM | POA: Diagnosis not present

## 2022-01-01 DIAGNOSIS — M9904 Segmental and somatic dysfunction of sacral region: Secondary | ICD-10-CM | POA: Diagnosis not present

## 2022-01-01 DIAGNOSIS — M5416 Radiculopathy, lumbar region: Secondary | ICD-10-CM | POA: Diagnosis not present

## 2022-01-01 DIAGNOSIS — M9903 Segmental and somatic dysfunction of lumbar region: Secondary | ICD-10-CM | POA: Diagnosis not present

## 2022-01-01 DIAGNOSIS — M9902 Segmental and somatic dysfunction of thoracic region: Secondary | ICD-10-CM | POA: Diagnosis not present

## 2022-01-08 DIAGNOSIS — M9902 Segmental and somatic dysfunction of thoracic region: Secondary | ICD-10-CM | POA: Diagnosis not present

## 2022-01-08 DIAGNOSIS — M9903 Segmental and somatic dysfunction of lumbar region: Secondary | ICD-10-CM | POA: Diagnosis not present

## 2022-01-08 DIAGNOSIS — M5416 Radiculopathy, lumbar region: Secondary | ICD-10-CM | POA: Diagnosis not present

## 2022-01-08 DIAGNOSIS — M9904 Segmental and somatic dysfunction of sacral region: Secondary | ICD-10-CM | POA: Diagnosis not present

## 2022-01-11 DIAGNOSIS — Z0001 Encounter for general adult medical examination with abnormal findings: Secondary | ICD-10-CM | POA: Diagnosis not present

## 2022-01-11 DIAGNOSIS — Z720 Tobacco use: Secondary | ICD-10-CM | POA: Diagnosis not present

## 2022-01-11 DIAGNOSIS — Z125 Encounter for screening for malignant neoplasm of prostate: Secondary | ICD-10-CM | POA: Diagnosis not present

## 2022-01-11 DIAGNOSIS — I10 Essential (primary) hypertension: Secondary | ICD-10-CM | POA: Diagnosis not present

## 2022-01-11 DIAGNOSIS — Z136 Encounter for screening for cardiovascular disorders: Secondary | ICD-10-CM | POA: Diagnosis not present

## 2022-01-11 DIAGNOSIS — Z1329 Encounter for screening for other suspected endocrine disorder: Secondary | ICD-10-CM | POA: Diagnosis not present

## 2022-01-16 DIAGNOSIS — M9904 Segmental and somatic dysfunction of sacral region: Secondary | ICD-10-CM | POA: Diagnosis not present

## 2022-01-16 DIAGNOSIS — M9903 Segmental and somatic dysfunction of lumbar region: Secondary | ICD-10-CM | POA: Diagnosis not present

## 2022-01-16 DIAGNOSIS — M9902 Segmental and somatic dysfunction of thoracic region: Secondary | ICD-10-CM | POA: Diagnosis not present

## 2022-01-16 DIAGNOSIS — M5416 Radiculopathy, lumbar region: Secondary | ICD-10-CM | POA: Diagnosis not present

## 2022-01-23 DIAGNOSIS — E119 Type 2 diabetes mellitus without complications: Secondary | ICD-10-CM | POA: Diagnosis not present

## 2022-01-23 DIAGNOSIS — Z125 Encounter for screening for malignant neoplasm of prostate: Secondary | ICD-10-CM | POA: Diagnosis not present

## 2022-01-23 DIAGNOSIS — Z136 Encounter for screening for cardiovascular disorders: Secondary | ICD-10-CM | POA: Diagnosis not present

## 2022-01-23 DIAGNOSIS — Z0001 Encounter for general adult medical examination with abnormal findings: Secondary | ICD-10-CM | POA: Diagnosis not present

## 2022-01-23 DIAGNOSIS — I10 Essential (primary) hypertension: Secondary | ICD-10-CM | POA: Diagnosis not present

## 2022-01-25 DIAGNOSIS — I10 Essential (primary) hypertension: Secondary | ICD-10-CM | POA: Diagnosis not present

## 2022-01-25 DIAGNOSIS — Z720 Tobacco use: Secondary | ICD-10-CM | POA: Diagnosis not present

## 2022-01-30 DIAGNOSIS — M9904 Segmental and somatic dysfunction of sacral region: Secondary | ICD-10-CM | POA: Diagnosis not present

## 2022-01-30 DIAGNOSIS — M9903 Segmental and somatic dysfunction of lumbar region: Secondary | ICD-10-CM | POA: Diagnosis not present

## 2022-01-30 DIAGNOSIS — M5416 Radiculopathy, lumbar region: Secondary | ICD-10-CM | POA: Diagnosis not present

## 2022-01-30 DIAGNOSIS — M9902 Segmental and somatic dysfunction of thoracic region: Secondary | ICD-10-CM | POA: Diagnosis not present

## 2022-02-20 DIAGNOSIS — M9902 Segmental and somatic dysfunction of thoracic region: Secondary | ICD-10-CM | POA: Diagnosis not present

## 2022-02-20 DIAGNOSIS — M9903 Segmental and somatic dysfunction of lumbar region: Secondary | ICD-10-CM | POA: Diagnosis not present

## 2022-02-20 DIAGNOSIS — M9904 Segmental and somatic dysfunction of sacral region: Secondary | ICD-10-CM | POA: Diagnosis not present

## 2022-02-20 DIAGNOSIS — M5416 Radiculopathy, lumbar region: Secondary | ICD-10-CM | POA: Diagnosis not present

## 2022-04-10 DIAGNOSIS — M5416 Radiculopathy, lumbar region: Secondary | ICD-10-CM | POA: Diagnosis not present

## 2022-04-10 DIAGNOSIS — M9902 Segmental and somatic dysfunction of thoracic region: Secondary | ICD-10-CM | POA: Diagnosis not present

## 2022-04-10 DIAGNOSIS — M9904 Segmental and somatic dysfunction of sacral region: Secondary | ICD-10-CM | POA: Diagnosis not present

## 2022-04-10 DIAGNOSIS — M9903 Segmental and somatic dysfunction of lumbar region: Secondary | ICD-10-CM | POA: Diagnosis not present

## 2022-05-22 DIAGNOSIS — M9904 Segmental and somatic dysfunction of sacral region: Secondary | ICD-10-CM | POA: Diagnosis not present

## 2022-05-22 DIAGNOSIS — M9902 Segmental and somatic dysfunction of thoracic region: Secondary | ICD-10-CM | POA: Diagnosis not present

## 2022-05-22 DIAGNOSIS — M9903 Segmental and somatic dysfunction of lumbar region: Secondary | ICD-10-CM | POA: Diagnosis not present

## 2022-05-22 DIAGNOSIS — M5416 Radiculopathy, lumbar region: Secondary | ICD-10-CM | POA: Diagnosis not present

## 2022-07-09 DIAGNOSIS — I10 Essential (primary) hypertension: Secondary | ICD-10-CM | POA: Diagnosis not present

## 2022-07-09 DIAGNOSIS — Z720 Tobacco use: Secondary | ICD-10-CM | POA: Diagnosis not present

## 2022-07-09 DIAGNOSIS — E119 Type 2 diabetes mellitus without complications: Secondary | ICD-10-CM | POA: Diagnosis not present

## 2022-10-08 DIAGNOSIS — I10 Essential (primary) hypertension: Secondary | ICD-10-CM | POA: Diagnosis not present

## 2022-10-08 DIAGNOSIS — Z720 Tobacco use: Secondary | ICD-10-CM | POA: Diagnosis not present

## 2022-10-08 DIAGNOSIS — E119 Type 2 diabetes mellitus without complications: Secondary | ICD-10-CM | POA: Diagnosis not present

## 2023-02-04 DIAGNOSIS — Z125 Encounter for screening for malignant neoplasm of prostate: Secondary | ICD-10-CM | POA: Diagnosis not present

## 2023-02-04 DIAGNOSIS — Z0001 Encounter for general adult medical examination with abnormal findings: Secondary | ICD-10-CM | POA: Diagnosis not present

## 2023-02-04 DIAGNOSIS — E119 Type 2 diabetes mellitus without complications: Secondary | ICD-10-CM | POA: Diagnosis not present

## 2023-02-04 DIAGNOSIS — I10 Essential (primary) hypertension: Secondary | ICD-10-CM | POA: Diagnosis not present

## 2023-02-04 DIAGNOSIS — Z1329 Encounter for screening for other suspected endocrine disorder: Secondary | ICD-10-CM | POA: Diagnosis not present

## 2023-02-04 DIAGNOSIS — Z136 Encounter for screening for cardiovascular disorders: Secondary | ICD-10-CM | POA: Diagnosis not present

## 2023-02-04 DIAGNOSIS — E559 Vitamin D deficiency, unspecified: Secondary | ICD-10-CM | POA: Diagnosis not present

## 2023-02-04 DIAGNOSIS — Z720 Tobacco use: Secondary | ICD-10-CM | POA: Diagnosis not present

## 2023-07-04 DIAGNOSIS — Z720 Tobacco use: Secondary | ICD-10-CM | POA: Diagnosis not present

## 2023-07-04 DIAGNOSIS — E119 Type 2 diabetes mellitus without complications: Secondary | ICD-10-CM | POA: Diagnosis not present

## 2023-07-04 DIAGNOSIS — E559 Vitamin D deficiency, unspecified: Secondary | ICD-10-CM | POA: Diagnosis not present

## 2023-07-04 DIAGNOSIS — I10 Essential (primary) hypertension: Secondary | ICD-10-CM | POA: Diagnosis not present

## 2023-08-17 IMAGING — CR DG LUMBAR SPINE COMPLETE 4+V
5 series · 5 of 5 positions shown · non-contrast
Comparison: None.

CLINICAL DATA: pain at L lateral inferior area after "pop"
sensation

EXAM:
LUMBAR SPINE - COMPLETE 4+ VIEW

[t lumbar spine ap]
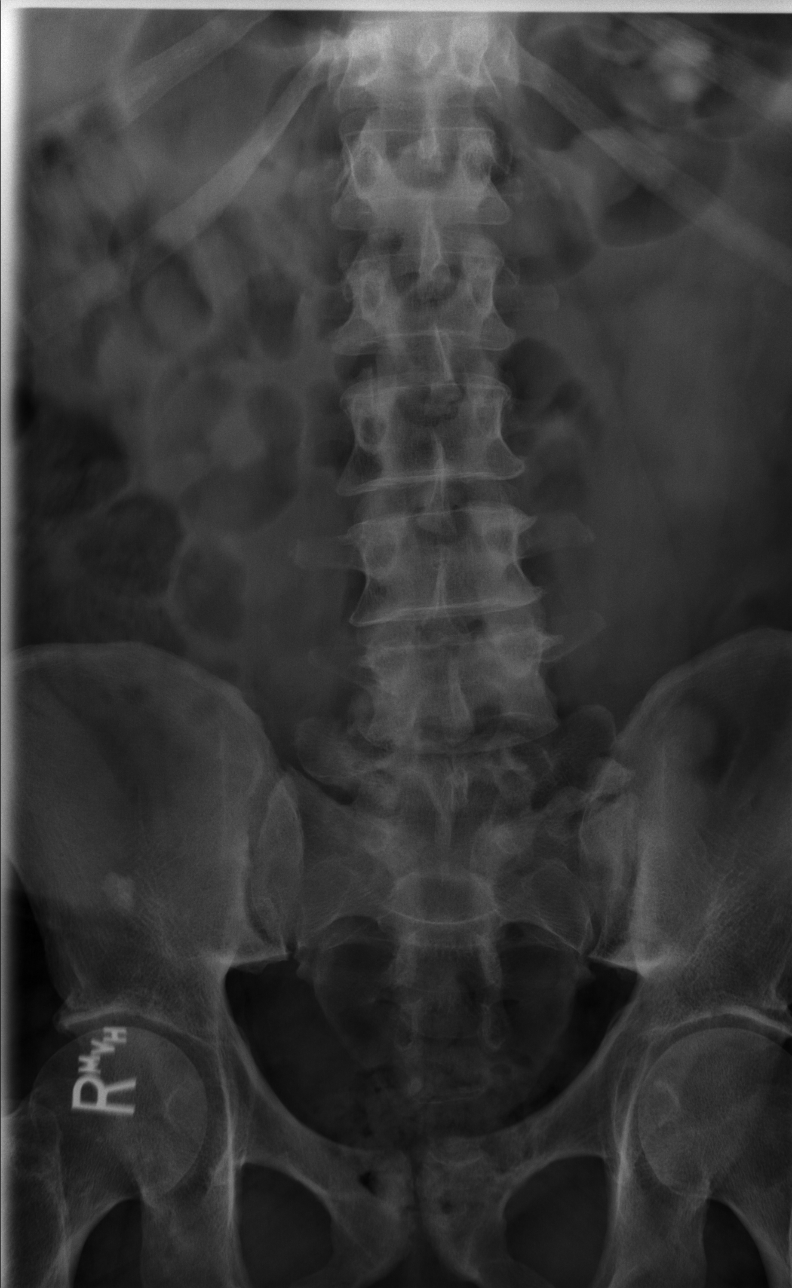

[t lumbar spine obl (1 of 2)]
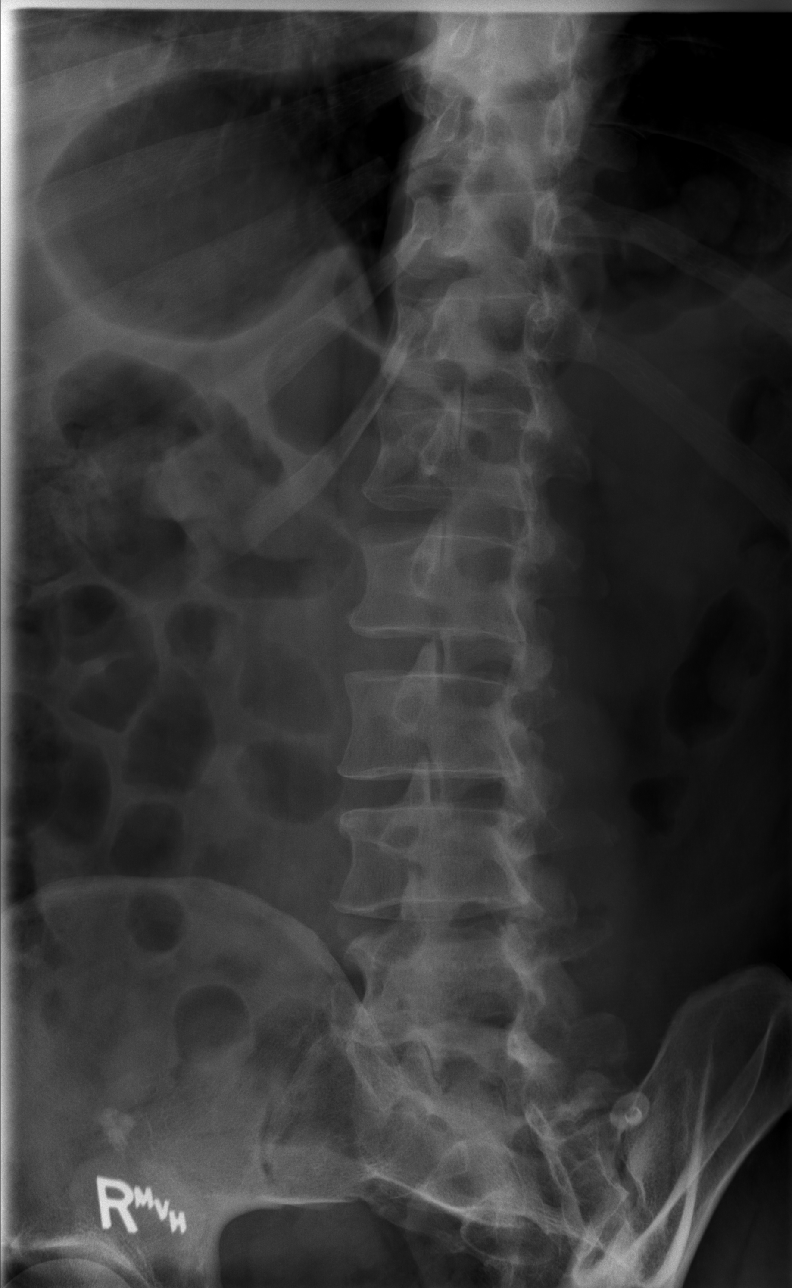

[t lumbar spine obl (2 of 2)]
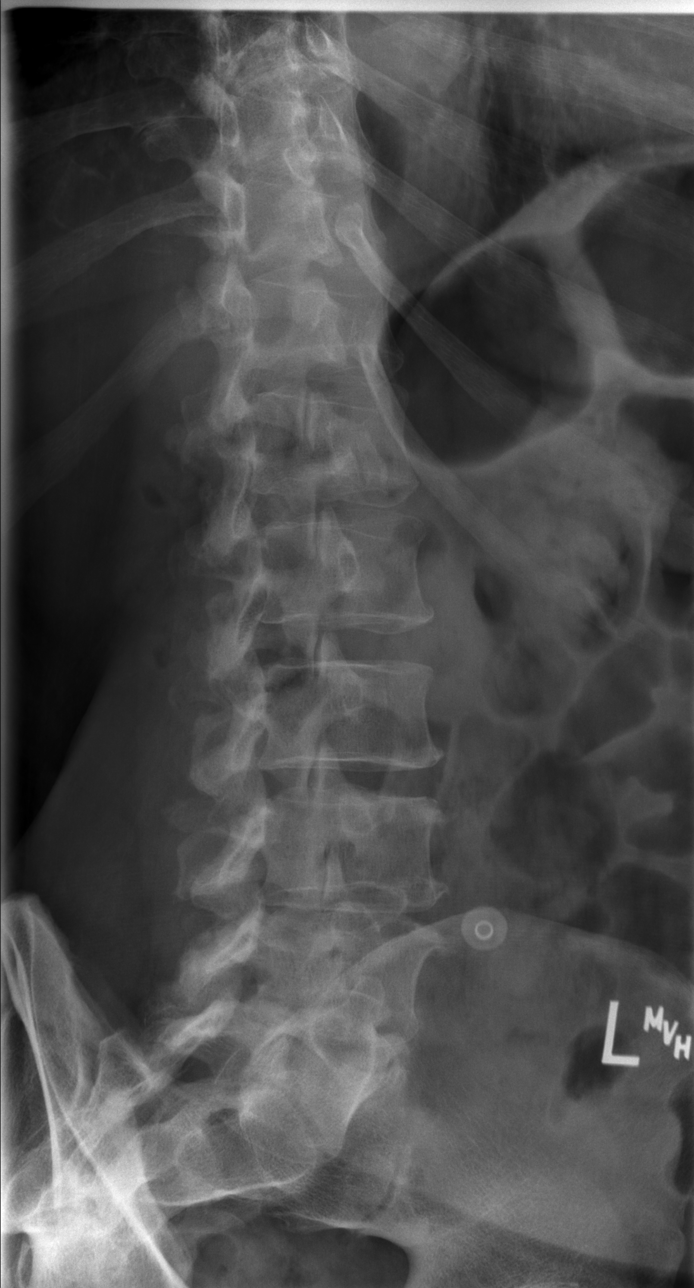

[t lumbar spine lat]
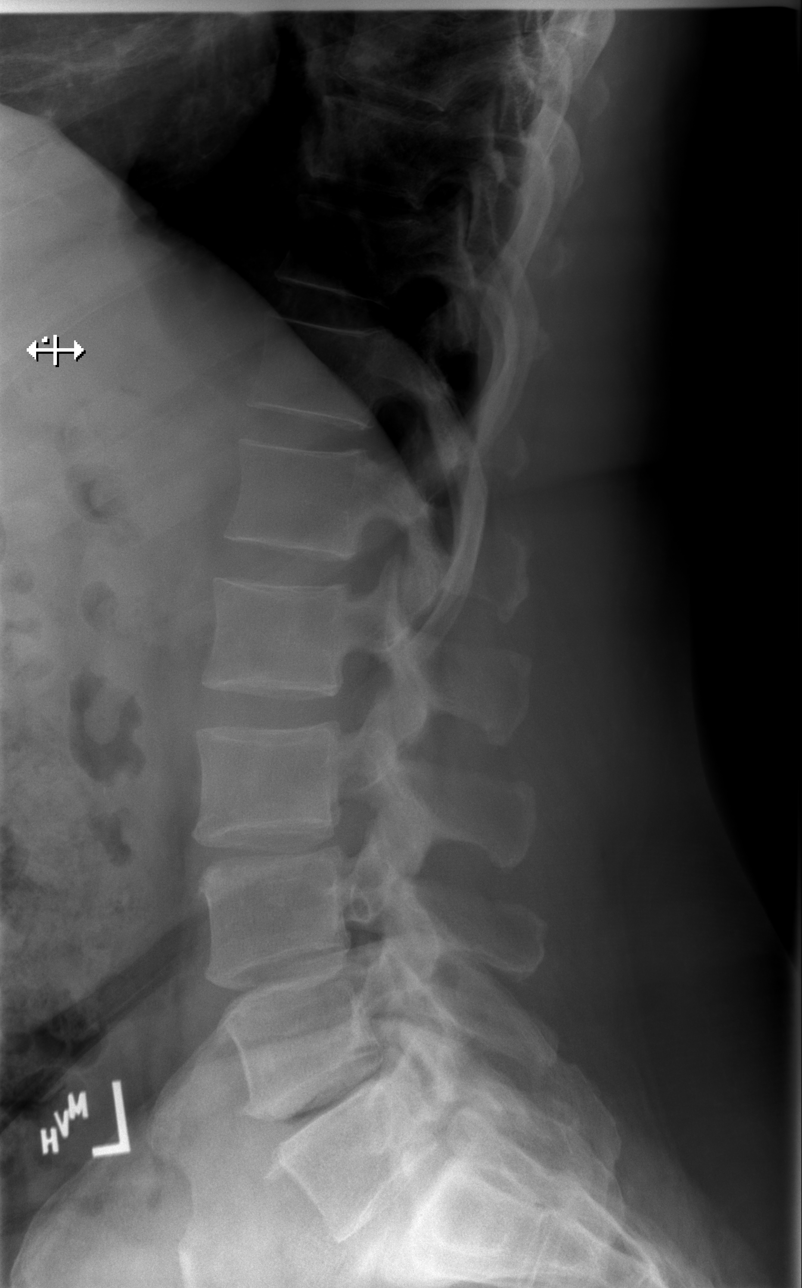

[t lumbar l-5 s-1 spot]
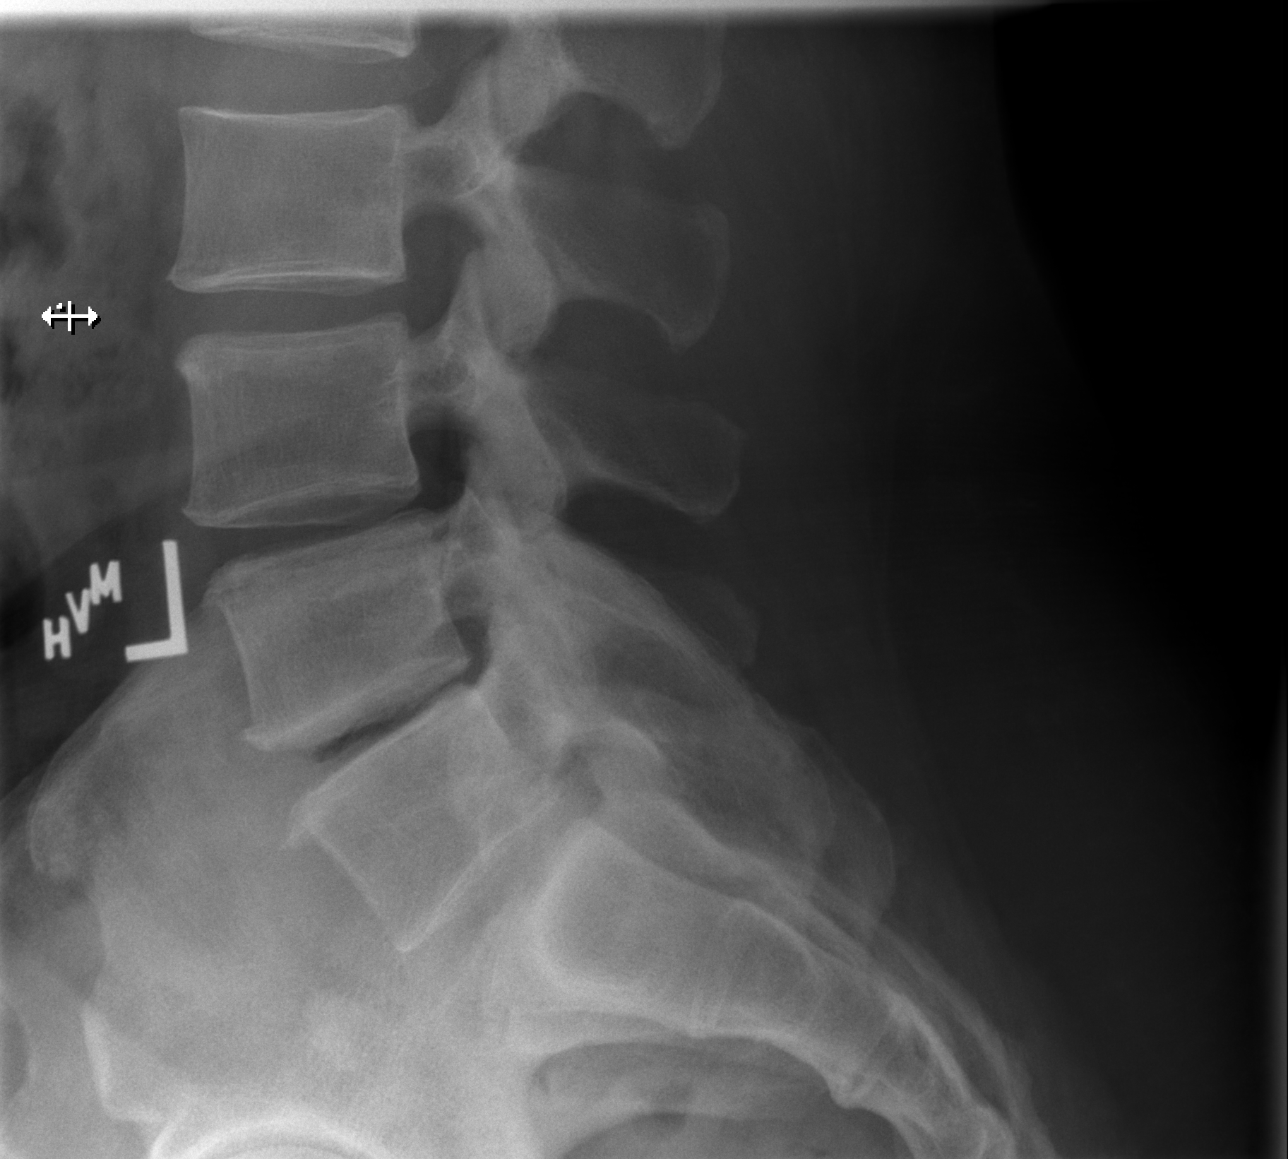

[5 of 5 positions shown; findings below may reference images not displayed]

FINDINGS: There appear to be small ribs at T12. The last well-defined
vertebral body is labeled L5 for purposes of this exam. There are 5
non-rib-bearing lumbar-type vertebral bodies. Normal alignment. The
lumbar vertebral body heights are maintained. There is multilevel
degenerative disc disease of the lower lumbar spine which is most
advanced and moderate in severity at L4-L5. The soft tissues are
unremarkable.
IMPRESSION: Multilevel degenerative disc disease of the lower lumbar spine most
pronounced at L4-L5.

## 2024-01-13 DIAGNOSIS — Z125 Encounter for screening for malignant neoplasm of prostate: Secondary | ICD-10-CM | POA: Diagnosis not present

## 2024-01-13 DIAGNOSIS — I10 Essential (primary) hypertension: Secondary | ICD-10-CM | POA: Diagnosis not present

## 2024-01-13 DIAGNOSIS — Z720 Tobacco use: Secondary | ICD-10-CM | POA: Diagnosis not present

## 2024-01-13 DIAGNOSIS — E119 Type 2 diabetes mellitus without complications: Secondary | ICD-10-CM | POA: Diagnosis not present

## 2024-01-13 DIAGNOSIS — Z0001 Encounter for general adult medical examination with abnormal findings: Secondary | ICD-10-CM | POA: Diagnosis not present

## 2024-01-13 DIAGNOSIS — E559 Vitamin D deficiency, unspecified: Secondary | ICD-10-CM | POA: Diagnosis not present

## 2024-04-14 DIAGNOSIS — M19072 Primary osteoarthritis, left ankle and foot: Secondary | ICD-10-CM | POA: Diagnosis not present

## 2024-04-14 DIAGNOSIS — M19071 Primary osteoarthritis, right ankle and foot: Secondary | ICD-10-CM | POA: Diagnosis not present

## 2024-05-18 DIAGNOSIS — E119 Type 2 diabetes mellitus without complications: Secondary | ICD-10-CM | POA: Diagnosis not present

## 2024-05-18 DIAGNOSIS — I10 Essential (primary) hypertension: Secondary | ICD-10-CM | POA: Diagnosis not present

## 2024-05-18 DIAGNOSIS — E559 Vitamin D deficiency, unspecified: Secondary | ICD-10-CM | POA: Diagnosis not present

## 2024-05-18 DIAGNOSIS — Z720 Tobacco use: Secondary | ICD-10-CM | POA: Diagnosis not present

## 2024-09-21 DIAGNOSIS — E559 Vitamin D deficiency, unspecified: Secondary | ICD-10-CM | POA: Diagnosis not present

## 2024-09-21 DIAGNOSIS — Z720 Tobacco use: Secondary | ICD-10-CM | POA: Diagnosis not present

## 2024-09-21 DIAGNOSIS — E119 Type 2 diabetes mellitus without complications: Secondary | ICD-10-CM | POA: Diagnosis not present

## 2024-09-21 DIAGNOSIS — I10 Essential (primary) hypertension: Secondary | ICD-10-CM | POA: Diagnosis not present
